# Patient Record
Sex: Male | Born: 1990 | Hispanic: Yes | Marital: Single | Smoking: Never smoker
Health system: Southern US, Community
[De-identification: ages and names within clinical notes are randomized; demographics above are authoritative.]

---

## 2013-06-27 ENCOUNTER — Emergency Department (HOSPITAL_COMMUNITY): Payer: No Typology Code available for payment source

## 2013-06-27 ENCOUNTER — Encounter (HOSPITAL_COMMUNITY): Payer: Self-pay | Admitting: Emergency Medicine

## 2013-06-27 ENCOUNTER — Emergency Department (HOSPITAL_COMMUNITY)
Admission: EM | Admit: 2013-06-27 | Discharge: 2013-06-27 | Disposition: A | Discharge status: Home / Self Care | Payer: No Typology Code available for payment source | Attending: Emergency Medicine | Admitting: Emergency Medicine

## 2013-06-27 DIAGNOSIS — S20212A Contusion of left front wall of thorax, initial encounter: Secondary | ICD-10-CM

## 2013-06-27 DIAGNOSIS — S8001XA Contusion of right knee, initial encounter: Secondary | ICD-10-CM

## 2013-06-27 DIAGNOSIS — S8000XA Contusion of unspecified knee, initial encounter: Secondary | ICD-10-CM | POA: Insufficient documentation

## 2013-06-27 DIAGNOSIS — Y9389 Activity, other specified: Secondary | ICD-10-CM | POA: Insufficient documentation

## 2013-06-27 DIAGNOSIS — T07XXXA Unspecified multiple injuries, initial encounter: Secondary | ICD-10-CM

## 2013-06-27 DIAGNOSIS — Z23 Encounter for immunization: Secondary | ICD-10-CM | POA: Insufficient documentation

## 2013-06-27 DIAGNOSIS — Y92410 Unspecified street and highway as the place of occurrence of the external cause: Secondary | ICD-10-CM | POA: Insufficient documentation

## 2013-06-27 DIAGNOSIS — S20219A Contusion of unspecified front wall of thorax, initial encounter: Secondary | ICD-10-CM | POA: Insufficient documentation

## 2013-06-27 DIAGNOSIS — IMO0002 Reserved for concepts with insufficient information to code with codable children: Secondary | ICD-10-CM | POA: Insufficient documentation

## 2013-06-27 MED ORDER — TETANUS-DIPHTH-ACELL PERTUSSIS 5-2.5-18.5 LF-MCG/0.5 IM SUSP
0.5000 mL | Freq: Once | INTRAMUSCULAR | Status: AC
  Administered 2013-06-27: 0.5 mL via INTRAMUSCULAR
  Filled 2013-06-27: qty 0.5

## 2013-06-27 MED ORDER — IBUPROFEN 800 MG PO TABS: 800.0000 mg | ORAL_TABLET | Freq: Three times a day (TID) | ORAL | Status: AC

## 2013-06-27 MED ORDER — IBUPROFEN 800 MG PO TABS
800.0000 mg | ORAL_TABLET | Freq: Once | ORAL | Status: AC
  Administered 2013-06-27: 800 mg via ORAL
  Filled 2013-06-27: qty 1

## 2013-06-27 MED ORDER — CYCLOBENZAPRINE HCL 10 MG PO TABS: 10.0000 mg | ORAL_TABLET | Freq: Three times a day (TID) | ORAL | Status: AC | PRN
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
## 2013-06-27 NOTE — ED Provider Notes (Signed)
Medical screening examination/treatment/procedure(s) were performed by non-physician practitioner and as supervising physician I was immediately available for consultation/collaboration.   EKG Interpretation None        Roberto B. Bernette MayersSheldon, MD 06/27/13 [phone]

## 2013-06-27 NOTE — ED Notes (Signed)
PA at bedside.
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
